# Patient Record
Sex: Female | Born: 1935
Health system: Southern US, Community
[De-identification: ages and names within clinical notes are randomized; demographics above are authoritative.]

---

## 2009-07-26 ENCOUNTER — Encounter: Admission: RE | Admit: 2009-07-26 | Discharge: 2009-07-26 | Payer: Self-pay | Admitting: Family Medicine

## 2009-08-03 ENCOUNTER — Encounter: Admission: RE | Admit: 2009-08-03 | Discharge: 2009-08-03 | Payer: Self-pay | Admitting: Family Medicine

## 2010-01-08 ENCOUNTER — Encounter: Admission: RE | Admit: 2010-01-08 | Discharge: 2010-01-08 | Payer: Self-pay | Admitting: Family Medicine

## 2010-08-14 ENCOUNTER — Other Ambulatory Visit: Payer: Self-pay | Admitting: Family Medicine

## 2010-08-14 DIAGNOSIS — R42 Dizziness and giddiness: Secondary | ICD-10-CM

## 2010-08-15 ENCOUNTER — Other Ambulatory Visit: Payer: Self-pay | Admitting: Family Medicine

## 2010-08-15 ENCOUNTER — Other Ambulatory Visit: Payer: Self-pay

## 2010-08-15 ENCOUNTER — Ambulatory Visit
Admission: RE | Admit: 2010-08-15 | Discharge: 2010-08-15 | Disposition: A | Discharge status: Home / Self Care | Payer: Medicare Other | Source: Ambulatory Visit | Attending: Family Medicine | Admitting: Family Medicine

## 2010-08-15 DIAGNOSIS — Z1231 Encounter for screening mammogram for malignant neoplasm of breast: Secondary | ICD-10-CM

## 2010-08-15 DIAGNOSIS — R42 Dizziness and giddiness: Secondary | ICD-10-CM

## 2010-08-27 ENCOUNTER — Ambulatory Visit
Admission: RE | Admit: 2010-08-27 | Discharge: 2010-08-27 | Disposition: A | Discharge status: Home / Self Care | Payer: Medicare Other | Source: Ambulatory Visit | Attending: Family Medicine | Admitting: Family Medicine

## 2010-08-27 DIAGNOSIS — Z1231 Encounter for screening mammogram for malignant neoplasm of breast: Secondary | ICD-10-CM

## 2010-11-05 ENCOUNTER — Other Ambulatory Visit: Payer: Self-pay | Admitting: Family Medicine

## 2010-11-05 DIAGNOSIS — R1011 Right upper quadrant pain: Secondary | ICD-10-CM

## 2010-11-06 ENCOUNTER — Ambulatory Visit
Admission: RE | Admit: 2010-11-06 | Discharge: 2010-11-06 | Disposition: A | Discharge status: Home / Self Care | Payer: Medicare Other | Source: Ambulatory Visit | Attending: Family Medicine | Admitting: Family Medicine

## 2010-11-06 DIAGNOSIS — R1011 Right upper quadrant pain: Secondary | ICD-10-CM

## 2011-02-06 ENCOUNTER — Other Ambulatory Visit: Payer: Self-pay | Admitting: Family Medicine

## 2011-02-06 ENCOUNTER — Ambulatory Visit
Admission: RE | Admit: 2011-02-06 | Discharge: 2011-02-06 | Disposition: A | Discharge status: Home / Self Care | Payer: Medicare Other | Source: Ambulatory Visit | Attending: Family Medicine | Admitting: Family Medicine

## 2011-02-06 DIAGNOSIS — M25552 Pain in left hip: Secondary | ICD-10-CM

## 2011-04-07 ENCOUNTER — Other Ambulatory Visit: Payer: Self-pay | Admitting: Family Medicine

## 2011-04-07 ENCOUNTER — Ambulatory Visit
Admission: RE | Admit: 2011-04-07 | Discharge: 2011-04-07 | Disposition: A | Discharge status: Home / Self Care | Payer: Medicare Other | Source: Ambulatory Visit | Attending: Family Medicine | Admitting: Family Medicine

## 2011-04-07 DIAGNOSIS — M25562 Pain in left knee: Secondary | ICD-10-CM

## 2011-08-22 ENCOUNTER — Other Ambulatory Visit: Payer: Self-pay | Admitting: Family Medicine

## 2011-08-22 DIAGNOSIS — Z1231 Encounter for screening mammogram for malignant neoplasm of breast: Secondary | ICD-10-CM

## 2011-08-27 ENCOUNTER — Other Ambulatory Visit: Payer: Self-pay | Admitting: Family Medicine

## 2011-08-27 DIAGNOSIS — M8580 Other specified disorders of bone density and structure, unspecified site: Secondary | ICD-10-CM

## 2011-08-27 DIAGNOSIS — Z780 Asymptomatic menopausal state: Secondary | ICD-10-CM

## 2011-09-09 ENCOUNTER — Ambulatory Visit
Admission: RE | Admit: 2011-09-09 | Discharge: 2011-09-09 | Disposition: A | Discharge status: Home / Self Care | Payer: Medicare Other | Source: Ambulatory Visit | Attending: Family Medicine | Admitting: Family Medicine

## 2011-09-09 ENCOUNTER — Other Ambulatory Visit: Payer: Medicare Other

## 2011-09-09 DIAGNOSIS — M8580 Other specified disorders of bone density and structure, unspecified site: Secondary | ICD-10-CM

## 2011-09-09 DIAGNOSIS — Z780 Asymptomatic menopausal state: Secondary | ICD-10-CM

## 2011-09-09 DIAGNOSIS — Z1231 Encounter for screening mammogram for malignant neoplasm of breast: Secondary | ICD-10-CM

## 2012-10-18 ENCOUNTER — Other Ambulatory Visit: Payer: Self-pay | Admitting: Family Medicine

## 2012-10-18 DIAGNOSIS — Z139 Encounter for screening, unspecified: Secondary | ICD-10-CM

## 2012-10-26 ENCOUNTER — Ambulatory Visit (INDEPENDENT_AMBULATORY_CARE_PROVIDER_SITE_OTHER): Payer: Medicare Other

## 2012-10-26 DIAGNOSIS — Z1231 Encounter for screening mammogram for malignant neoplasm of breast: Secondary | ICD-10-CM

## 2012-10-26 DIAGNOSIS — Z139 Encounter for screening, unspecified: Secondary | ICD-10-CM

## 2013-03-15 IMAGING — CR DG HIP (WITH OR WITHOUT PELVIS) 2-3V*L*
2 series · 2 of 2 positions shown · non-contrast
Comparison: None.

CLINICAL DATA: Chronic left hip pain, no known injury

LEFT HIP - COMPLETE 2+ VIEW

[view not recorded (1 of 2)]
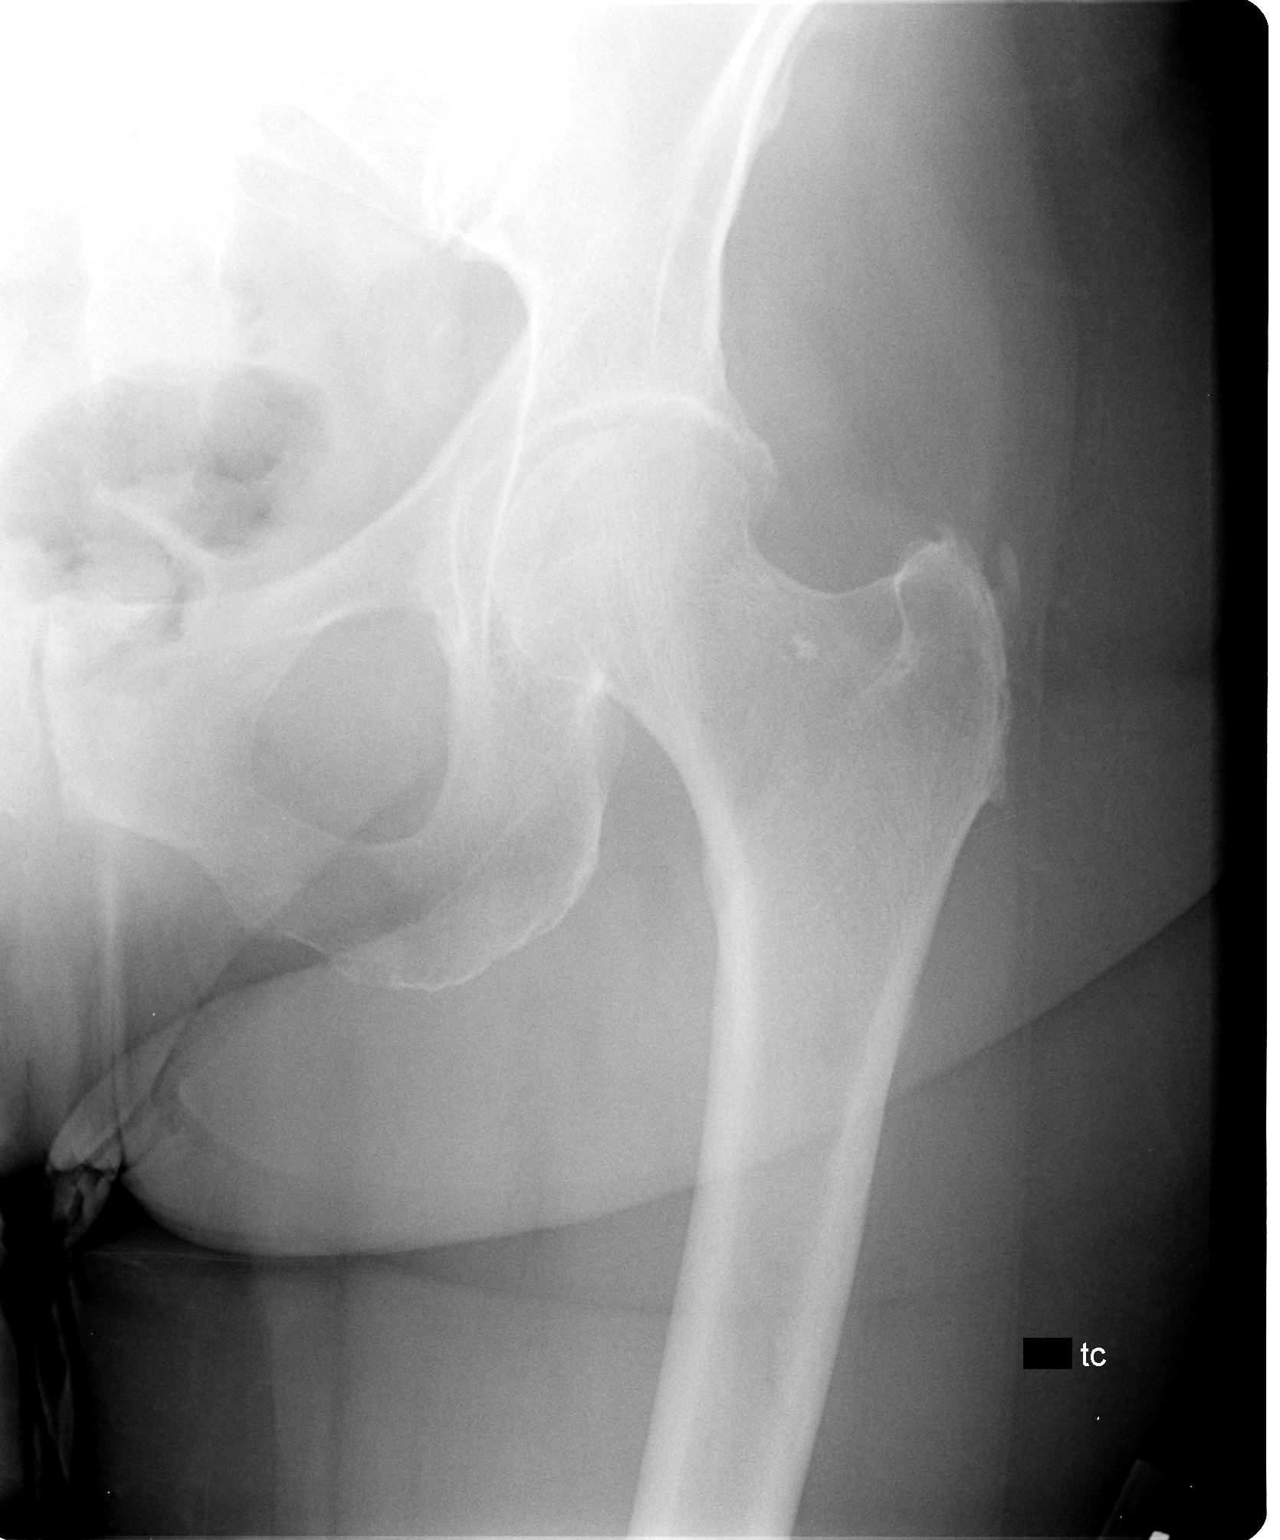

[view not recorded (2 of 2)]
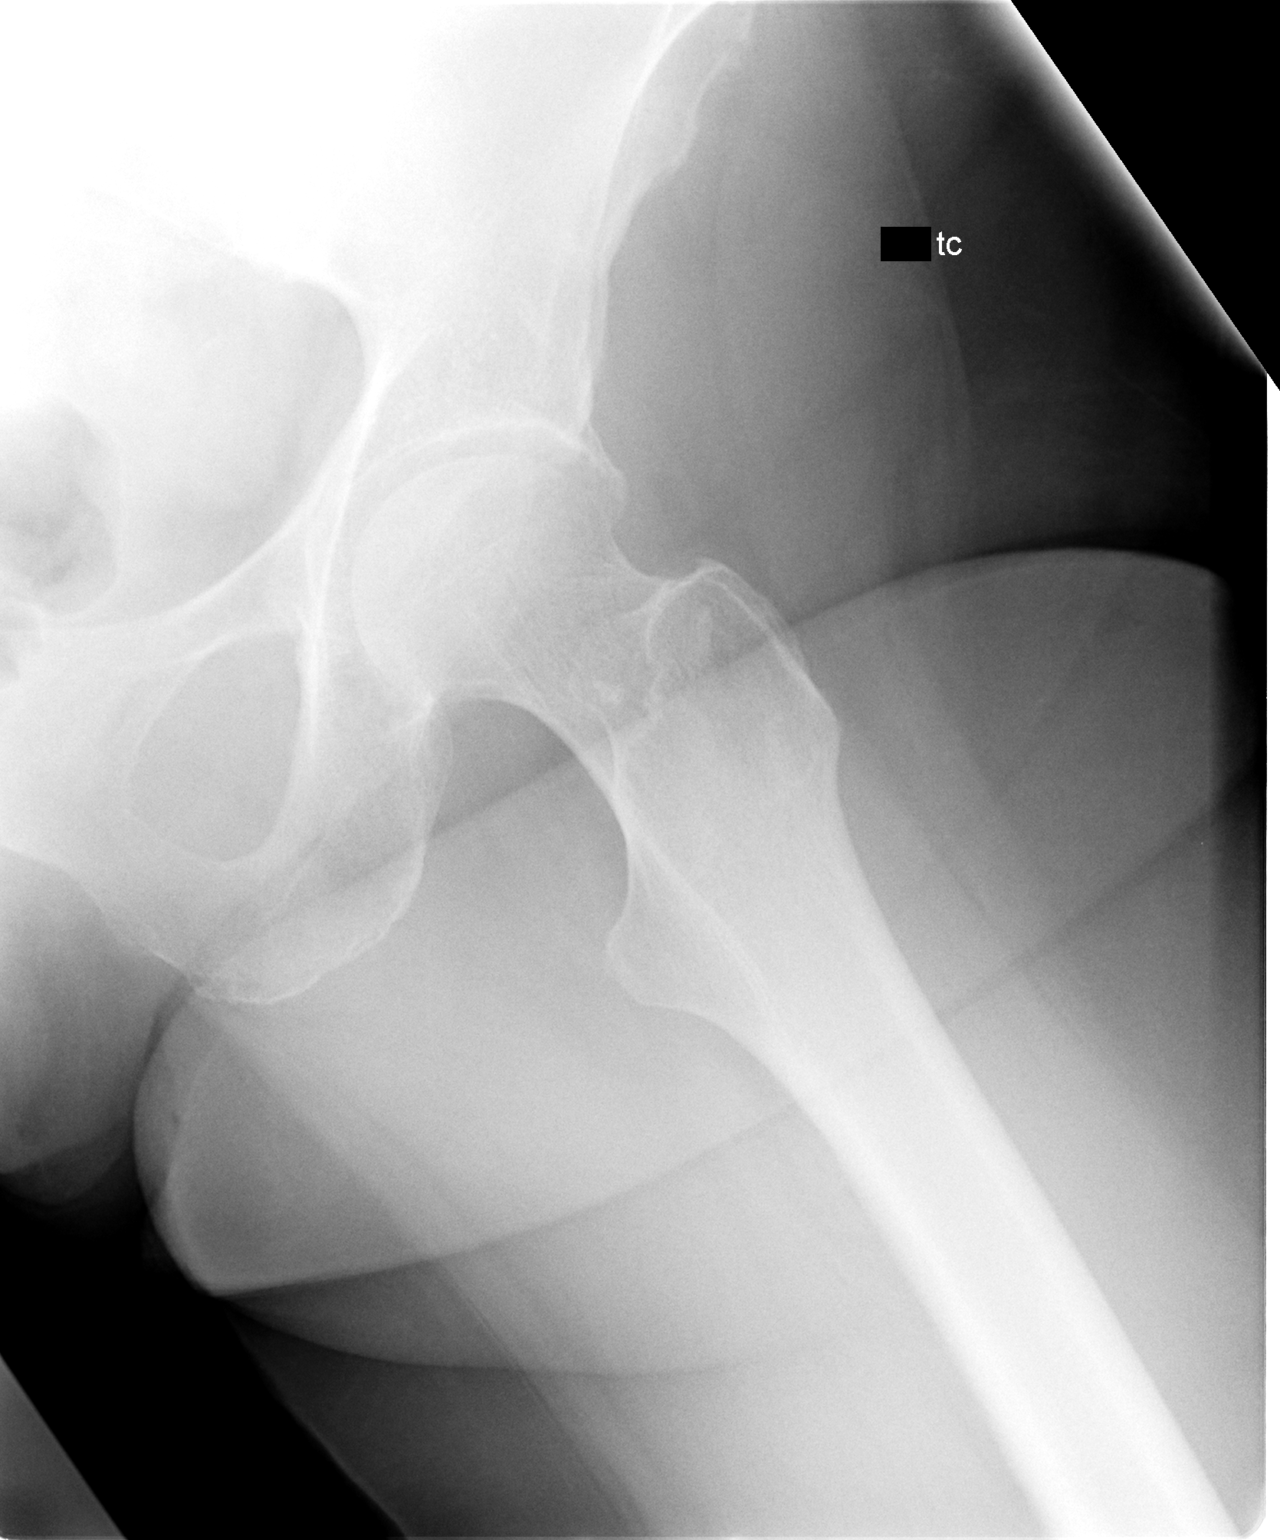

[2 of 2 positions shown; findings below may reference images not displayed]

FINDINGS: There is only mild degenerative change with some superior
acetabular spurring.  The left hip joint space s relatively well
preserved.  No fracture is seen.  The left ramus appears intact.
The left SI joint is corticated.
IMPRESSION: Mild degenerative change for age.  No acute abnormality.

## 2013-04-07 ENCOUNTER — Ambulatory Visit (INDEPENDENT_AMBULATORY_CARE_PROVIDER_SITE_OTHER): Payer: Medicare Other

## 2013-04-07 ENCOUNTER — Other Ambulatory Visit: Payer: Self-pay | Admitting: Family Medicine

## 2013-04-07 DIAGNOSIS — I517 Cardiomegaly: Secondary | ICD-10-CM

## 2013-04-07 DIAGNOSIS — R0602 Shortness of breath: Secondary | ICD-10-CM

## 2013-06-19 ENCOUNTER — Emergency Department
Admission: EM | Admit: 2013-06-19 | Discharge: 2013-06-19 | Discharge status: Against Medical Advice | Payer: Medicare Other | Source: Home / Self Care

## 2020-03-21 ENCOUNTER — Emergency Department (INDEPENDENT_AMBULATORY_CARE_PROVIDER_SITE_OTHER): Payer: Medicare HMO

## 2020-03-21 ENCOUNTER — Other Ambulatory Visit: Payer: Self-pay

## 2020-03-21 ENCOUNTER — Emergency Department
Admission: RE | Admit: 2020-03-21 | Discharge: 2020-03-21 | Disposition: A | Discharge status: Home / Self Care | Payer: Self-pay | Source: Ambulatory Visit | Attending: Family Medicine | Admitting: Family Medicine

## 2020-03-21 VITALS — BP 112/68 | HR 87 | Temp 99.1°F | Resp 18

## 2020-03-21 DIAGNOSIS — J209 Acute bronchitis, unspecified: Secondary | ICD-10-CM

## 2020-03-21 DIAGNOSIS — R05 Cough: Secondary | ICD-10-CM

## 2020-03-21 MED ORDER — DOXYCYCLINE HYCLATE 100 MG PO CAPS: ORAL_CAPSULE | ORAL | 0 refills | Status: DC

## 2020-03-21 NOTE — Discharge Instructions (Addendum)
Take plain guaifenesin (1200mg extended release tabs such as Mucinex) twice daily, with plenty of water, for cough and congestion.  Get adequate rest.   May use Afrin nasal spray (or generic oxymetazoline) each morning for about 5 days and then discontinue.  Also recommend using saline nasal spray several times daily and saline nasal irrigation (AYR is a common brand).  Use Flonase nasal spray each morning after using Afrin nasal spray and saline nasal irrigation. Try warm salt water gargles for sore throat.  Stop all antihistamines for now, and other non-prescription cough/cold preparations. May take Delsym Cough Suppressant ("12 Hour Cough Relief") at bedtime for nighttime cough.    

## 2020-03-21 NOTE — ED Triage Notes (Signed)
Pt c/o cough and fatigue x 2 weeks. Hx of bronchitis and pneumonia. Robitussin DM prn.

## 2020-03-21 NOTE — ED Provider Notes (Addendum)
[facility]    CSN: [id number] Arrival date & time: 03/21/20  0905      History   Chief Complaint Chief Complaint  Patient presents with  . Appointment    9am  . Cough  . Fatigue    HPI Meghan Kennedy is a 86 y.o. female.   Patient complains of persistent cough and fatigue for two weeks.  She has a history of bronchitis and pneumonia.  The history is provided by the patient.    History reviewed. No pertinent past medical history.  There are no problems to display for this patient.   History reviewed. No pertinent surgical history.  OB History   No obstetric history on file.      Home Medications    Prior to Admission medications   Medication Sig Start Date End Date Taking? Authorizing Provider  citalopram (CELEXA) 20 MG tablet Take by mouth. 02/01/20  Yes [provider]  traMADol (ULTRAM) 50 MG tablet TAKE 2 TABLETS BY MOUTH EVERY 12 HOURS AS NEEDED 02/01/20  Yes [provider]  doxycycline (VIBRAMYCIN) 100 MG capsule Take one cap PO Q12hr with food. 03/21/20   Lattie Haw, MD    Family History History reviewed. No pertinent family history.  Social History Social History   Tobacco Use  . Smoking status: Never Smoker  . Smokeless tobacco: Never Used  Vaping Use  . Vaping Use: Never used  Substance Use Topics  . Alcohol use: Not Currently  . Drug use: Not on file     Allergies   Sulfa antibiotics   Review of Systems Review of Systems No sore throat + cough No pleuritic pain No wheezing No nasal congestion No post-nasal drainage No sinus pain/pressure No itchy/red eyes No earache No hemoptysis No SOB No fever/chills No nausea No vomiting No abdominal pain No diarrhea No urinary symptoms No skin rash + fatigue No myalgias No headache   Physical Exam Triage Vital Signs ED Triage Vitals  Enc Vitals Group     BP 03/21/20 0946 112/68     Pulse Rate 03/21/20 0946 87     Resp 03/21/20 0946 18       Temp 03/21/20 0946 99.1 F (37.3 C)     Temp Source 03/21/20 0946 Oral     SpO2 03/21/20 0946 97 %     Weight --      Height --      Head Circumference --      Peak Flow --      Pain Score 03/21/20 0947 0     Pain Loc --      Pain Edu? --      Excl. in GC? --    No data found.  Updated Vital Signs BP 112/68 (BP Location: Right Arm)   Pulse 87   Temp 99.1 F (37.3 C) (Oral)   Resp 18   SpO2 97%   Visual Acuity Right Eye Distance:   Left Eye Distance:   Bilateral Distance:    Right Eye Near:   Left Eye Near:    Bilateral Near:     Physical Exam Nursing notes and Vital Signs reviewed. Appearance:  Patient appears stated age, and in no acute distress Eyes:  Pupils are equal, round, and reactive to light and accomodation.  Extraocular movement is intact.  Conjunctivae are not inflamed  Ears:  Canals normal.  Tympanic membranes normal.  Nose:  Mildly congested turbinates.  No sinus tenderness.  Pharynx:  Normal Neck:  Supple. No adenopathy Lungs:  Clear to auscultation.  Breath sounds are equal.  Moving air well. Heart:  Regular rate and rhythm without murmurs, rubs, or gallops.  Abdomen:  Nontender without masses or hepatosplenomegaly.  Bowel sounds are present.  No CVA or flank tenderness.  Extremities:  No edema.  Skin:  No rash present.   UC Treatments / Results  Labs (all labs ordered are listed, but only abnormal results are displayed) Labs Reviewed - No data to display  EKG   Radiology DG Chest 2 View  Result Date: 03/21/2020 CLINICAL DATA:  Cough x 2 weeks. EXAM: CHEST - 2 VIEW COMPARISON:  Prior chest radiographs 04/07/2013. FINDINGS: Heart size within normal limits. Aortic atherosclerosis. No appreciable airspace consolidation or pulmonary edema. No evidence of pleural effusion or pneumothorax. No acute bony abnormality identified. Thoracolumbar spondylosis. Levocurvature of the lower thoracic and upper lumbar spine. IMPRESSION: No evidence of active  cardiopulmonary disease. Aortic Atherosclerosis (ICD10-I70.0). Electronically Signed   By: Jackey Loge DO   On: 03/21/2020 10:01    Procedures Procedures (including critical care time)  Medications Ordered in UC Medications - No data to display  Initial Impression / Assessment and Plan / UC Course  I have reviewed the triage vital signs and the nursing notes.  Pertinent labs & imaging results that were available during my care of the patient were reviewed by me and considered in my medical decision making (see chart for details).    Negative chest x-ray reassuring. Begin doxycycline. Followup with Family Doctor if not improved in one week.    Final Clinical Impressions(s) / UC Diagnoses   Final diagnoses:  Acute bronchitis, unspecified organism     Discharge Instructions     Take plain guaifenesin (1200mg  extended release tabs such as Mucinex) twice daily, with plenty of water, for cough and congestion. Get adequate rest.   May use Afrin nasal spray (or generic oxymetazoline) each morning for about 5 days and then discontinue.  Also recommend using saline nasal spray several times daily and saline nasal irrigation (AYR is a common brand).  Use Flonase nasal spray each morning after using Afrin nasal spray and saline nasal irrigation. Try warm salt water gargles for sore throat.  Stop all antihistamines for now, and other non-prescription cough/cold preparations. May take Delsym Cough Suppressant ("12 Hour Cough Relief") at bedtime for nighttime cough.       ED Prescriptions    Medication Sig Dispense Auth. Provider   doxycycline (VIBRAMYCIN) 100 MG capsule Take one cap PO Q12hr with food. 14 capsule , MD        Lattie Haw, MD 03/22/20 1353    03/24/20, MD 03/24/20 [phone]

## 2020-03-25 ENCOUNTER — Other Ambulatory Visit: Payer: Self-pay

## 2020-03-25 ENCOUNTER — Encounter: Payer: Self-pay | Admitting: Emergency Medicine

## 2020-03-25 ENCOUNTER — Emergency Department
Admission: EM | Admit: 2020-03-25 | Discharge: 2020-03-25 | Disposition: A | Discharge status: Home / Self Care | Payer: Medicare HMO | Source: Home / Self Care | Attending: Family Medicine | Admitting: Family Medicine

## 2020-03-25 DIAGNOSIS — R05 Cough: Secondary | ICD-10-CM

## 2020-03-25 DIAGNOSIS — R053 Chronic cough: Secondary | ICD-10-CM

## 2020-03-25 LAB — POCT CBC W AUTO DIFF (K'VILLE URGENT CARE)

## 2020-03-25 MED ORDER — PREDNISONE 20 MG PO TABS: ORAL_TABLET | ORAL | 0 refills | Status: AC

## 2020-03-25 MED ORDER — GUAIFENESIN-CODEINE 100-10 MG/5ML PO SOLN: ORAL | 0 refills | Status: AC

## 2020-03-25 MED ORDER — CEFDINIR 300 MG PO CAPS: 300.0000 mg | ORAL_CAPSULE | Freq: Two times a day (BID) | ORAL | 0 refills | Status: AC

## 2020-03-25 NOTE — Discharge Instructions (Addendum)
May continue plain guaifenesin (1200mg  extended release tabs such as Mucinex) twice daily, with plenty of water, for cough and congestion.    Stop doxycycline.  If symptoms become significantly worse during the night or over the weekend, proceed to the local emergency room.

## 2020-03-25 NOTE — ED Triage Notes (Signed)
Patient here for worsening symptoms from visit 03/21/20; no known fever; feels she has not responded adequately on antibiotic. PCP will not see her because she has not had the covid vaccination. Has not been vaccinated for covid.

## 2020-03-25 NOTE — ED Provider Notes (Signed)
[facility]    CSN: [id number] Arrival date & time: 03/25/20  1144      History   Chief Complaint Chief Complaint  Patient presents with  . Cough  . Nasal Congestion  . Fatigue    HPI Meghan Kennedy is a 86 y.o. female.   Patient reports that she has not improved since her visit four days ago and continues to cough.  She denies shortness of breath, fever, and pleuritic pain.  She remains fatigued.  She states that her present illness lasting 18 days has lasted longer than previous URI's.  The history is provided by the patient and a relative.    History reviewed. No pertinent past medical history.  There are no problems to display for this patient.   History reviewed. No pertinent surgical history.  OB History   No obstetric history on file.      Home Medications    Prior to Admission medications   Medication Sig Start Date End Date Taking? Authorizing Provider  cefdinir (OMNICEF) 300 MG capsule Take 1 capsule (300 mg total) by mouth 2 (two) times daily. 03/25/20   Lattie Haw, MD  citalopram (CELEXA) 20 MG tablet Take by mouth. 02/01/20   [provider]  guaiFENesin-codeine 100-10 MG/5ML syrup Take 74mL by mouth at bedtime as needed for cough.  Repeat dose in about 6 hours. 03/25/20   Lattie Haw, MD  predniSONE (DELTASONE) 20 MG tablet Take one tab by mouth twice daily for 4 days, then one daily. Take with food. 03/25/20   Lattie Haw, MD  traMADol (ULTRAM) 50 MG tablet TAKE 2 TABLETS BY MOUTH EVERY 12 HOURS AS NEEDED 02/01/20   [provider]    Family History No family history on file.  Social History Social History   Tobacco Use  . Smoking status: Never Smoker  . Smokeless tobacco: Never Used  Vaping Use  . Vaping Use: Never used  Substance Use Topics  . Alcohol use: Not Currently  . Drug use: Not on file     Allergies   Sulfa antibiotics   Review of Systems Review of Systems  No sore throat +  cough No pleuritic pain No wheezing + nasal congestion No post-nasal drainage No sinus pain/pressure No itchy/red eyes No earache No hemoptysis No SOB No fever/chills No nausea No vomiting No abdominal pain No diarrhea No urinary symptoms No skin rash + fatigue No myalgias No headache     Physical Exam Triage Vital Signs ED Triage Vitals  Enc Vitals Group     BP 03/25/20 1405 (!) 146/81     Pulse Rate 03/25/20 1405 71     Resp 03/25/20 1405 18     Temp 03/25/20 1405 98.9 F (37.2 C)     Temp Source 03/25/20 1405 Oral     SpO2 03/25/20 1405 98 %     Weight --      Height --      Head Circumference --      Peak Flow --      Pain Score 03/25/20 1406 7     Pain Loc --      Pain Edu? --      Excl. in GC? --    No data found.  Updated Vital Signs BP (!) 146/81 (BP Location: Right Arm)   Pulse 71   Temp 98.9 F (37.2 C) (Oral)   Resp 18   SpO2 98%   Visual Acuity Right Eye Distance:  Left Eye Distance:   Bilateral Distance:    Right Eye Near:   Left Eye Near:    Bilateral Near:     Physical Exam Nursing notes and Vital Signs reviewed. Appearance:  Patient appears stated age, and in no acute distress Eyes:  Pupils are equal, round, and reactive to light and accomodation.  Extraocular movement is intact.  Conjunctivae are not inflamed  Ears:  Canals normal.  Tympanic membranes normal.  Nose:  Mildly congested turbinates.  No sinus tenderness.  Pharynx:  Normal Neck:  Supple. No adenopathy. Lungs:  Clear to auscultation.  Breath sounds are equal.  Moving air well. Heart:  Regular rate and rhythm without murmurs, rubs, or gallops.  Abdomen:  Nontender without masses or hepatosplenomegaly.  Bowel sounds are present.  No CVA or flank tenderness.  Extremities:  No edema.  Skin:  No rash present.   UC Treatments / Results  Labs (all labs ordered are listed, but only abnormal results are displayed) Labs Reviewed  POCT CBC W AUTO DIFF ([facility]):  WBC 5.0; LY 23.4; MO 6.5; GR 70.1; Hgb 12.2; Platelets 340     EKG   Radiology No results found.  Procedures Procedures (including critical care time)  Medications Ordered in UC Medications - No data to display  Initial Impression / Assessment and Plan / UC Course  I have reviewed the triage vital signs and the nursing notes.  Pertinent labs & imaging results that were available during my care of the patient were reviewed by me and considered in my medical decision making (see chart for details).    Patient may have had COVID19 infection causing her prolonged cough and fatigue. Will switch to [facility].  Begin prednisone burst/taper. Rx for Robitussin AC for night time cough. Followup with pulmonologist if not improved 10 days.  Final Clinical Impressions(s) / UC Diagnoses   Final diagnoses:  Persistent cough     Discharge Instructions     May continue plain guaifenesin (1200mg  extended release tabs such as Mucinex) twice daily, with plenty of water, for cough and congestion.    Stop doxycycline.  If symptoms become significantly worse during the night or over the weekend, proceed to the local emergency room.     ED Prescriptions    Medication Sig Dispense Auth. Provider   cefdinir (OMNICEF) 300 MG capsule Take 1 capsule (300 mg total) by mouth 2 (two) times daily. 20 capsule , MD   predniSONE (DELTASONE) 20 MG tablet Take one tab by mouth twice daily for 4 days, then one daily. Take with food. 12 tablet Lattie Haw, MD   guaiFENesin-codeine 100-10 MG/5ML syrup Take 16mL by mouth at bedtime as needed for cough.  Repeat dose in about 6 hours. 100 mL 9m, MD        Lattie Haw, MD 03/28/20 [phone]
